# Patient Record
Sex: Female | Born: 2005 | Race: Black or African American | Hispanic: No | Marital: Single | Smoking: Never smoker
Health system: Southern US, Community
[De-identification: ages and names within clinical notes are randomized; demographics above are authoritative.]

## PROBLEM LIST (undated history)

## (undated) DIAGNOSIS — T7840XA Allergy, unspecified, initial encounter: Secondary | ICD-10-CM

## (undated) DIAGNOSIS — J45909 Unspecified asthma, uncomplicated: Secondary | ICD-10-CM

## (undated) DIAGNOSIS — F419 Anxiety disorder, unspecified: Secondary | ICD-10-CM

## (undated) HISTORY — DX: Unspecified asthma, uncomplicated: J45.909

## (undated) HISTORY — PX: NO PAST SURGERIES: SHX2092

## (undated) HISTORY — DX: Anxiety disorder, unspecified: F41.9

## (undated) HISTORY — DX: Allergy, unspecified, initial encounter: T78.40XA

---
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
## 2023-02-07 ENCOUNTER — Ambulatory Visit (INDEPENDENT_AMBULATORY_CARE_PROVIDER_SITE_OTHER): Payer: No Typology Code available for payment source

## 2023-02-07 DIAGNOSIS — J4550 Severe persistent asthma, uncomplicated: Secondary | ICD-10-CM

## 2023-03-14 ENCOUNTER — Ambulatory Visit: Payer: No Typology Code available for payment source | Admitting: Internal Medicine

## 2023-03-14 DIAGNOSIS — J309 Allergic rhinitis, unspecified: Secondary | ICD-10-CM

## 2023-04-04 ENCOUNTER — Ambulatory Visit: Payer: No Typology Code available for payment source

## 2023-04-04 DIAGNOSIS — J4550 Severe persistent asthma, uncomplicated: Secondary | ICD-10-CM | POA: Diagnosis not present

## 2023-06-03 ENCOUNTER — Encounter: Payer: Self-pay | Admitting: Allergy & Immunology

## 2023-06-03 ENCOUNTER — Ambulatory Visit: Payer: No Typology Code available for payment source

## 2023-06-03 ENCOUNTER — Ambulatory Visit: Payer: No Typology Code available for payment source | Admitting: Allergy & Immunology

## 2023-06-03 VITALS — BP 110/80 | HR 87 | Temp 97.8°F | Resp 14 | Ht 66.5 in | Wt 161.4 lb

## 2023-06-03 DIAGNOSIS — J4550 Severe persistent asthma, uncomplicated: Secondary | ICD-10-CM

## 2023-06-03 DIAGNOSIS — J3089 Other allergic rhinitis: Secondary | ICD-10-CM

## 2023-06-03 DIAGNOSIS — J302 Other seasonal allergic rhinitis: Secondary | ICD-10-CM | POA: Diagnosis not present

## 2023-06-03 MED ORDER — CETIRIZINE HCL 10 MG PO TABS: 10.0000 mg | ORAL_TABLET | Freq: Two times a day (BID) | ORAL | 5 refills | Status: DC | PRN

## 2023-06-03 MED ORDER — BREZTRI AEROSPHERE 160-9-4.8 MCG/ACT IN AERO: 2.0000 | INHALATION_SPRAY | Freq: Two times a day (BID) | RESPIRATORY_TRACT | 5 refills | Status: DC

## 2023-06-03 MED ORDER — FLUTICASONE PROPIONATE 50 MCG/ACT NA SUSP: NASAL | 5 refills | Status: AC

## 2023-06-03 NOTE — Progress Notes (Signed)
 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 Moderate persistent asthma, uncomplicated - Lung testing looks beautiful today (over 100%).  - Daily controller medication(s): Breztri  two puffs twice daily + Fasenra  every 8 wks - Prior to physical activity: albuterol  2 puffs 10-15 minutes before physical activity. - Rescue medications: albuterol  4 puffs every 4-6 hours as needed - Asthma control goals:  * Full participation in all desired activities (may need albuterol  before activity) * Albuterol  use two time or less a week on average (not counting use with activity) * Cough interfering with sleep two time or less a month * Oral steroids no more than once a year * No hospitalizations  2. Seasonal and perennial allergic rhinitis - Continue with fluticasone  two sprays per nostril twice daily. - Continue with cetirizine  10mg  daily (can use twice daily on bad days).  - Consider allergy shots in the future for long term control.   3. Return in about 6 months (around 12/01/2023).    Subjective:   Beth Cook is a 18 y.o. female presenting today for follow up of  Chief Complaint  Patient presents with   Follow-up    Beth Cook has a history of the following: Patient Active Problem List   Diagnosis Date Noted   Severe persistent asthma with acute exacerbation 04/30/2022   Seasonal and perennial allergic rhinitis 04/30/2022   Numbness and tingling in right hand 02/26/2020     History obtained from: chart review and patient and mother.  Discussed the use of AI scribe software for clinical note transcription with the patient and/or guardian, who gave verbal consent to proceed.  Beth Cook is a 18 y.o. female presenting for a follow up visit.  She was last seen in July 2024.  At that time, we continued with Breztri  2 puffs twice daily as well as Fasenra  every 8 weeks.  For her rhinitis, she was under good control with cetirizine  and Flonase .  Since last visit, she has done very well.   Asthma/Respiratory Symptom History: She remains on Breztri  (two puffs twice a day). This seems to be working very well. She has not been on prednisone  and she has not been using her rescue inhaler.  She expresses satisfaction with her current treatment, describing herself as a new person. She has been managing her symptoms well and has not required any additional medical attention. Beth Cook's asthma has been well controlled. She has not required rescue medication, experienced nocturnal awakenings due to lower respiratory symptoms, nor have activities of daily living been limited. She has required no Emergency Department or Urgent Care visits for her asthma. She has required zero courses of systemic steroids for asthma exacerbations since the last visit. ACT score today is 25, indicating excellent asthma symptom control.   Allergic Rhinitis Symptom History: The patient's medications include Flonase , Breztri , and Zyrtec , which are filled at a local [facility]. She has not reported any issues with medication coverage or access. She denies any recent infections including sinopulmonary infections.   She  still does not think that allergy shots are indicated at this point in time.   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 She has previously worked in engineering geologist.   Otherwise, there have been no changes to her past medical history,  surgical history, family history, or social history.    Review of systems otherwise negative other than that mentioned in the HPI.    Objective:   Blood pressure 110/80, pulse 87, temperature 97.8 F (36.6 C), resp. rate 14, height 5' 6.5 (1.689 m), weight 161 lb 6 oz (73.2 kg), SpO2 99%. Body mass index is 25.66 kg/m.    Physical Exam Vitals reviewed.  Constitutional:      Appearance: She is well-developed.  HENT:     Head: Normocephalic and atraumatic.     Right Ear: Tympanic membrane, ear canal and external ear normal.     Left Ear: Tympanic membrane, ear canal and external ear normal.     Nose: No nasal deformity, septal deviation, mucosal edema or rhinorrhea.     Right Turbinates: Enlarged, swollen and pale.     Left Turbinates: Enlarged, swollen and pale.     Right Sinus: No maxillary sinus tenderness or frontal sinus tenderness.     Left Sinus: No maxillary sinus tenderness or frontal sinus tenderness.     Comments: No polyps noted.     Mouth/Throat:     Mouth: Mucous membranes are not pale and not dry.     Pharynx: Uvula midline.  Eyes:     General: Lids are normal. No allergic shiner.       Right eye: No discharge.        Left eye: No discharge.     Conjunctiva/sclera: Conjunctivae normal.     Right eye: Right conjunctiva is not injected. No chemosis.    Left eye: Left conjunctiva is not injected. No chemosis.    Pupils: Pupils are equal, round, and reactive to light.  Cardiovascular:     Rate and Rhythm: Normal rate and regular rhythm.     Heart sounds: Normal heart sounds.  Pulmonary:     Effort: Pulmonary effort is normal. No tachypnea, accessory muscle usage or respiratory distress.     Breath sounds: Normal breath sounds. No wheezing, rhonchi or rales.  Chest:     Chest wall: No tenderness.  Lymphadenopathy:     Cervical: No cervical adenopathy.  Skin:    Coloration: Skin is not pale.     Findings: No abrasion, erythema, petechiae or rash. Rash is  not papular, urticarial or vesicular.  Neurological:     Mental Status: She is alert.  Psychiatric:        Behavior: Behavior is cooperative.      Diagnostic studies:    Spirometry: results normal (FEV1: 3.36/110%, FVC: 4.28/123%, FEV1/FVC: 79%).    Spirometry consistent with normal pattern.    Allergy Studies: none       Marty Shaggy, MD  [facility] 

## 2023-06-03 NOTE — Patient Instructions (Addendum)
 1. Moderate persistent asthma, uncomplicated - Lung testing looks beautiful today (over 100%).  - Daily controller medication(s): Breztri  two puffs twice daily + Fasenra  every 8 wks - Prior to physical activity: albuterol  2 puffs 10-15 minutes before physical activity. - Rescue medications: albuterol  4 puffs every 4-6 hours as needed - Asthma control goals:  * Full participation in all desired activities (may need albuterol  before activity) * Albuterol  use two time or less a week on average (not counting use with activity) * Cough interfering with sleep two time or less a month * Oral steroids no more than once a year * No hospitalizations  2. Seasonal and perennial allergic rhinitis - Continue with fluticasone  two sprays per nostril twice daily. - Continue with cetirizine  10mg  daily (can use twice daily on bad days).  - Consider allergy shots in the future for long term control.   3. Return in about 6 months (around 12/01/2023).    Please inform us  of any Emergency Department visits, hospitalizations, or changes in symptoms. Call us  before going to the ED for breathing or allergy symptoms since we might be able to fit you in for a sick visit. Feel free to contact us  anytime with any questions, problems, or concerns.  It was a pleasure to see you and your family again today!  Websites that have reliable patient information: 1. American Academy of Asthma, Allergy, and Immunology: www.aaaai.org 2. Food Allergy Research and Education (FARE): foodallergy.org 3. Mothers of Asthmatics: http://www.asthmacommunitynetwork.org 4. American College of Allergy, Asthma, and Immunology: www.acaai.org   COVID-19 Vaccine Information can be found at: podexchange.nl For questions related to vaccine distribution or appointments, please email [email or web address] or call [phone].   We realize that you might be concerned about having an  allergic reaction to the COVID19 vaccines. To help with that concern, WE ARE OFFERING THE COVID19 VACCINES IN OUR OFFICE! Ask the front desk for dates!     "Like" us  on Facebook and Instagram for our latest updates!      A healthy democracy works best when [employer] participate! Make sure you are registered to vote! If you have moved or changed any of your contact information, you will need to get this updated before voting!  In some cases, you MAY be able to register to vote online: [email or web address]

## 2023-06-08 ENCOUNTER — Ambulatory Visit: Payer: No Typology Code available for payment source

## 2023-06-08 ENCOUNTER — Ambulatory Visit: Payer: No Typology Code available for payment source | Admitting: Family Medicine

## 2023-06-13 ENCOUNTER — Other Ambulatory Visit: Payer: Self-pay | Admitting: Allergy & Immunology

## 2023-06-20 ENCOUNTER — Other Ambulatory Visit: Payer: Self-pay

## 2023-06-20 ENCOUNTER — Emergency Department
Admission: EM | Admit: 2023-06-20 | Discharge: 2023-06-20 | Disposition: A | Discharge status: Home / Self Care | Payer: No Typology Code available for payment source | Attending: Emergency Medicine | Admitting: Emergency Medicine

## 2023-06-20 DIAGNOSIS — R112 Nausea with vomiting, unspecified: Secondary | ICD-10-CM | POA: Diagnosis present

## 2023-06-20 DIAGNOSIS — Z20822 Contact with and (suspected) exposure to covid-19: Secondary | ICD-10-CM | POA: Insufficient documentation

## 2023-06-20 DIAGNOSIS — K529 Noninfective gastroenteritis and colitis, unspecified: Secondary | ICD-10-CM | POA: Insufficient documentation

## 2023-06-20 LAB — CBC
HCT: 42.2 % (ref 36.0–46.0)
Hemoglobin: 14 g/dL (ref 12.0–15.0)
MCH: 27.9 pg (ref 26.0–34.0)
MCHC: 33.2 g/dL (ref 30.0–36.0)
MCV: 84.2 fL (ref 80.0–100.0)
Platelets: 348 10*3/uL (ref 150–400)
RBC: 5.01 MIL/uL (ref 3.87–5.11)
RDW: 13.6 % (ref 11.5–15.5)
WBC: 8.2 10*3/uL (ref 4.0–10.5)
nRBC: 0 % (ref 0.0–0.2)

## 2023-06-20 LAB — COMPREHENSIVE METABOLIC PANEL
ALT: 12 U/L (ref 0–44)
AST: 22 U/L (ref 15–41)
Albumin: 4.4 g/dL (ref 3.5–5.0)
Alkaline Phosphatase: 67 U/L (ref 38–126)
Anion gap: 14 (ref 5–15)
BUN: 17 mg/dL (ref 6–20)
CO2: 16 mmol/L — ABNORMAL LOW (ref 22–32)
Calcium: 9.8 mg/dL (ref 8.9–10.3)
Chloride: 107 mmol/L (ref 98–111)
Creatinine, Ser: 0.77 mg/dL (ref 0.44–1.00)
GFR, Estimated: >60 mL/min (ref >60)
Glucose, Bld: 138 mg/dL — ABNORMAL HIGH (ref 70–99)
Potassium: 3.6 mmol/L (ref 3.5–5.1)
Sodium: 137 mmol/L (ref 135–145)
Total Bilirubin: 1.4 mg/dL — ABNORMAL HIGH (ref 0.0–1.2)
Total Protein: 8.1 g/dL (ref 6.5–8.1)

## 2023-06-20 LAB — POC URINE PREG, ED: Preg Test, Ur: NEGATIVE

## 2023-06-20 LAB — URINALYSIS, ROUTINE W REFLEX MICROSCOPIC
Bacteria, UA: NONE SEEN
Glucose, UA: NEGATIVE mg/dL
Ketones, ur: 80 mg/dL — AB
Leukocytes,Ua: NEGATIVE
Nitrite: NEGATIVE
Protein, ur: >=300 mg/dL — AB
Specific Gravity, Urine: 1.035 — ABNORMAL HIGH (ref 1.005–1.030)
pH: 8 (ref 5.0–8.0)

## 2023-06-20 LAB — RESP PANEL BY RT-PCR (RSV, FLU A&B, COVID)  RVPGX2
Influenza A by PCR: NEGATIVE
Influenza B by PCR: NEGATIVE
Resp Syncytial Virus by PCR: NEGATIVE
SARS Coronavirus 2 by RT PCR: NEGATIVE

## 2023-06-20 LAB — LIPASE, BLOOD: Lipase: 24 U/L (ref 11–51)

## 2023-06-20 MED ORDER — ONDANSETRON 4 MG PO TBDP: 4.0000 mg | ORAL_TABLET | Freq: Three times a day (TID) | ORAL | 0 refills | Status: DC | PRN

## 2023-06-20 MED ORDER — ONDANSETRON HCL 4 MG/2ML IJ SOLN
4.0000 mg | Freq: Once | INTRAMUSCULAR | Status: AC
  Administered 2023-06-20: 4 mg via INTRAVENOUS
  Filled 2023-06-20: qty 2

## 2023-06-20 MED ORDER — DICYCLOMINE HCL 10 MG PO CAPS
10.0000 mg | ORAL_CAPSULE | Freq: Once | ORAL | Status: AC
  Administered 2023-06-20: 10 mg via ORAL
  Filled 2023-06-20: qty 1

## 2023-06-20 MED ORDER — DICYCLOMINE HCL 10 MG PO CAPS: 10.0000 mg | ORAL_CAPSULE | Freq: Three times a day (TID) | ORAL | 0 refills | Status: DC | PRN

## 2023-06-20 MED ORDER — ONDANSETRON 4 MG PO TBDP
4.0000 mg | ORAL_TABLET | Freq: Once | ORAL | Status: AC | PRN
  Administered 2023-06-20: 4 mg via ORAL
  Filled 2023-06-20: qty 1

## 2023-06-20 MED ORDER — SODIUM CHLORIDE 0.9 % IV BOLUS
1000.0000 mL | Freq: Once | INTRAVENOUS | Status: AC
  Administered 2023-06-20: 1000 mL via INTRAVENOUS

## 2023-06-20 NOTE — ED Provider Notes (Signed)
[facility] Provider Note    Event Date/Time   First MD Initiated Contact with Patient 06/20/23 1225     (approximate)   History   Emesis   HPI  Beth Cook is a 18 y.o. female  who presents to the emergency department today because of concern for nausea, vomiting and diarrhea accompanied by abdominal pain. Symptoms started overnight. She has had multiple episodes of both diarrhea and emesis. Has not been able to keep anything down. Has been having lower abdominal pain with this. The patient denies any known sick contacts or unusual ingestions. She feels that she has had fevers. Denies history of abdominal issues.     Physical Exam   Triage Vital Signs: ED Triage Vitals  Encounter Vitals Group     BP 06/20/23 0935 119/64     Systolic BP Percentile --      Diastolic BP Percentile --      Pulse Rate 06/20/23 0936 88     Resp 06/20/23 0935 16     Temp 06/20/23 0935 98.1 F (36.7 C)     Temp src --      SpO2 06/20/23 0936 100 %     Weight 06/20/23 0936 155 lb (70.3 kg)     Height 06/20/23 0936 5\' 6"  (1.676 m)     Head Circumference --      Peak Flow --      Pain Score 06/20/23 0935 9     Pain Loc --      Pain Education --      Exclude from Growth Chart --     Most recent vital signs: Vitals:   06/20/23 0935 06/20/23 0936  BP: 119/64   Pulse:  88  Resp: 16   Temp: 98.1 F (36.7 C)   SpO2:  100%   General: Awake, alert, oriented. CV:  Good peripheral perfusion. Regular rate and rhythm. Resp:  Normal effort. Lungs clear. Abd:  No distention. Tender to palpation in the lower abdomen.    ED Results / Procedures / Treatments   Labs (all labs ordered are listed, but only abnormal results are displayed) Labs Reviewed  COMPREHENSIVE METABOLIC PANEL - Abnormal; Notable for the following components:      Result Value   CO2 16 (*)    Glucose, Bld 138 (*)    Total Bilirubin 1.4 (*)    All other components within normal limits  URINALYSIS,  ROUTINE W REFLEX MICROSCOPIC - Abnormal; Notable for the following components:   Color, Urine AMBER (*)    APPearance HAZY (*)    Specific Gravity, Urine 1.035 (*)    Hgb urine dipstick SMALL (*)    Bilirubin Urine SMALL (*)    Ketones, ur 80 (*)    Protein, ur >=300 (*)    All other components within normal limits  RESP PANEL BY RT-PCR (RSV, FLU A&B, COVID)  RVPGX2  LIPASE, BLOOD  CBC  POC URINE PREG, ED     EKG  None   RADIOLOGY None  PROCEDURES:  Critical Care performed: No   MEDICATIONS ORDERED IN ED: Medications  ondansetron (ZOFRAN-ODT) disintegrating tablet 4 mg (4 mg Oral Given 06/20/23 0939)  sodium chloride 0.9 % bolus 1,000 mL (1,000 mLs Intravenous New Bag/Given 06/20/23 1330)  ondansetron (ZOFRAN) injection 4 mg (4 mg Intravenous Given 06/20/23 1330)     IMPRESSION / MDM / ASSESSMENT AND PLAN / ED COURSE  I reviewed the triage vital signs and the nursing notes.  Differential diagnosis includes, but is not limited to, gastroenteritis, food poisoning, pregnancy  Patient's presentation is most consistent with acute presentation with potential threat to life or bodily function.  Patient presented to the emergency department today because of concerns for nausea vomiting diarrhea that started last night.  On exam patient has slight abdominal tenderness.  Blood work however without leukocytosis patient is afebrile.  Do think likely gastroenteritis.  Patient will be given IV fluids and medication.   FINAL CLINICAL IMPRESSION(S) / ED DIAGNOSES   Final diagnoses:  Gastroenteritis     Note:  This document was prepared using Dragon voice recognition software and may include unintentional dictation errors.    Phineas Semen, MD 06/20/23 [phone]

## 2023-06-20 NOTE — Discharge Instructions (Addendum)
Please seek medical attention for any high fevers, chest pain, shortness of breath, change in behavior, persistent vomiting, bloody stool or any other new or concerning symptoms.

## 2023-06-20 NOTE — ED Triage Notes (Signed)
Pt to ED for emesis, diarrhea, chills started last night.

## 2023-07-15 ENCOUNTER — Ambulatory Visit: Payer: No Typology Code available for payment source

## 2023-08-01 ENCOUNTER — Ambulatory Visit: Payer: No Typology Code available for payment source

## 2023-08-05 ENCOUNTER — Ambulatory Visit: Payer: No Typology Code available for payment source

## 2023-08-05 DIAGNOSIS — J4550 Severe persistent asthma, uncomplicated: Secondary | ICD-10-CM

## 2023-09-30 ENCOUNTER — Ambulatory Visit

## 2023-09-30 DIAGNOSIS — J4550 Severe persistent asthma, uncomplicated: Secondary | ICD-10-CM | POA: Diagnosis not present

## 2023-10-14 ENCOUNTER — Telehealth: Payer: Self-pay | Admitting: Family Medicine

## 2023-10-14 NOTE — Telephone Encounter (Signed)
 Called and left patient voicemail about 6/10 appt being cancelled due to provider not available that day. Ok to reschedule if patient calls back

## 2023-10-27 ENCOUNTER — Ambulatory Visit: Admitting: Family Medicine

## 2023-11-01 ENCOUNTER — Ambulatory Visit: Admitting: Family Medicine

## 2023-12-09 ENCOUNTER — Other Ambulatory Visit: Payer: Self-pay

## 2023-12-09 ENCOUNTER — Ambulatory Visit

## 2023-12-09 ENCOUNTER — Ambulatory Visit: Payer: No Typology Code available for payment source | Admitting: Allergy & Immunology

## 2023-12-09 VITALS — BP 118/70 | HR 116 | Temp 98.5°F | Resp 18 | Ht 66.93 in | Wt 160.6 lb

## 2023-12-09 DIAGNOSIS — J4550 Severe persistent asthma, uncomplicated: Secondary | ICD-10-CM

## 2023-12-09 DIAGNOSIS — J302 Other seasonal allergic rhinitis: Secondary | ICD-10-CM | POA: Diagnosis not present

## 2023-12-09 DIAGNOSIS — J3089 Other allergic rhinitis: Secondary | ICD-10-CM

## 2023-12-09 NOTE — Patient Instructions (Addendum)
 1. Moderate persistent asthma, uncomplicated - Lung testing looks beautiful today (well over 100%).  - Daily controller medication(s): Breztri  one puff twice daily + Fasenra  every 8 wks - Prior to physical activity: albuterol  2 puffs 10-15 minutes before physical activity. - Rescue medications: albuterol  4 puffs every 4-6 hours as needed - Asthma control goals:  * Full participation in all desired activities (may need albuterol  before activity) * Albuterol  use two time or less a week on average (not counting use with activity) * Cough interfering with sleep two time or less a month * Oral steroids no more than once a year * No hospitalizations  2. Seasonal and perennial allergic rhinitis - Continue with fluticasone  two sprays per nostril twice daily. - Continue with cetirizine  10mg  daily (can use twice daily on bad days).   3. Return in about 6 months (around 06/10/2024). You can have the follow up appointment with Dr. Iva or a Nurse Practicioner (our Nurse Practitioners are excellent and always have Physician oversight!).    If you are interested in a [employer], email our Engineer, manufacturing ([place]: [email or web address])!!   Please inform us  of any Emergency Department visits, hospitalizations, or changes in symptoms. Call us  before going to the ED for breathing or allergy symptoms since we might be able to fit you in for a sick visit. Feel free to contact us  anytime with any questions, problems, or concerns.  It was a pleasure to see you and your family again today!  Websites that have reliable patient information: 1. American Academy of Asthma, Allergy, and Immunology: www.aaaai.org 2. Food Allergy Research and Education (FARE): foodallergy.org 3. Mothers of Asthmatics: http://www.asthmacommunitynetwork.org 4. American College of Allergy, Asthma, and Immunology: www.acaai.org      "Like" us  on Facebook and Instagram for our latest updates!       A healthy democracy works best when [employer] participate! Make sure you are registered to vote! If you have moved or changed any of your contact information, you will need to get this updated before voting! Scan the QR codes below to learn more!

## 2023-12-09 NOTE — Progress Notes (Signed)
 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 Moderate persistent asthma, uncomplicated - Lung testing looks beautiful today (well over 100%).  - Daily controller medication(s): Breztri  one puff twice daily + Fasenra  every 8 wks - Prior to physical activity: albuterol  2 puffs 10-15 minutes before physical activity. - Rescue medications: albuterol  4 puffs every 4-6 hours as needed - Asthma control goals:  * Full participation in all desired activities (may need albuterol  before activity) * Albuterol  use two time or less a week on average (not counting use with activity) * Cough interfering with sleep two time or less a month * Oral steroids no more than once a year * No hospitalizations  2. Seasonal and perennial allergic rhinitis - Continue with fluticasone  two sprays per nostril twice daily. - Continue with cetirizine  10mg  daily (can use twice daily on bad days).   3. Return in about 6 months (around 06/10/2024). You can have the follow up appointment with Dr. Iva or a Nurse Practicioner (our Nurse Practitioners are excellent and always have Physician oversight!).    Subjective:   Beth Cook is a 18 y.o. female presenting today for follow up of  Chief Complaint  Patient presents with   Asthma    ACT 25    Beth Cook has a history of the following: Patient Active Problem List   Diagnosis Date Noted   Severe persistent asthma with acute exacerbation 04/30/2022    Seasonal and perennial allergic rhinitis 04/30/2022   Numbness and tingling in right hand 02/26/2020    History obtained from: chart review and patient.  Discussed the use of AI scribe software for clinical note transcription with the patient and/or guardian, who gave verbal consent to proceed.  Beth Cook is a 18 y.o. female presenting for a follow up visit.  She was last seen in January 2025. At that time, lung testing looked awesome. She was continued on Breztri  two puffs BID as well as albuterol  as needed. For her rhinitis, we continued with the use of Flonase  and Zyrtec  daily. This seemed to be working well to control her symptoms. She had not needed any prednisone  or ED visits since the last time that we had seen her.  Since the last visit, she has done well.  Discussed the use of AI scribe software for clinical note transcription with the patient, who gave verbal consent to proceed.  History of Present Illness   Beth Cook is a 18 year old female with asthma who presents for a follow-up visit.  Asthma/Respiratory Symptom History: She has been doing well overall with her asthma management. She is using all her prescribed inhalers but is not consistently using her daily inhaler. She is receiving Fasenra  injections every 8 weeks for her asthma. She is currently using Symbicort for her asthma.  Allergic Rhinitis Symptom History: Her allergies are well-controlled with Flonase  and Zyrtec , which she continues to take and finds effective. She has not been on antibiotics for any sinus or ear infections.   In  terms of her social history, she is not currently in college and is exploring job opportunities, including server positions. 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 She lives in [place] and is open to job opportunities in nearby areas like [place] and [place].   Otherwise, there have been no changes to her past medical history,  surgical history, family history, or social history.    Review of systems otherwise negative other than that mentioned in the HPI.    Objective:   Blood pressure 118/70, pulse (!) 116, temperature 98.5 F (36.9 C), temperature source Temporal, resp. rate 18, height 5' 6.93 (1.7 m), weight 160 lb 9.6 oz (72.8 kg), SpO2 96%. Body mass index is 25.21 kg/m.    Physical Exam Vitals reviewed.  Constitutional:      Appearance: She is well-developed.  HENT:     Head: Normocephalic and atraumatic.     Right Ear: Tympanic membrane, ear canal and external ear normal.     Left Ear: Tympanic membrane, ear canal and external ear normal.     Nose: No nasal deformity, septal deviation, mucosal edema or rhinorrhea.     Right Turbinates: Enlarged, swollen and pale.     Left Turbinates: Enlarged, swollen and pale.     Right Sinus: No maxillary sinus tenderness or frontal sinus tenderness.     Left Sinus: No maxillary sinus tenderness or frontal sinus tenderness.     Comments: No polyps noted.     Mouth/Throat:     Mouth: Mucous membranes are not pale and not dry.     Pharynx: Uvula midline.  Eyes:     General: Lids are normal. No allergic shiner.       Right eye: No discharge.        Left eye: No discharge.     Conjunctiva/sclera: Conjunctivae normal.     Right eye: Right conjunctiva is not injected. No chemosis.    Left eye: Left conjunctiva is not injected. No chemosis.    Pupils: Pupils are equal, round, and reactive to light.  Cardiovascular:     Rate and Rhythm: Normal rate and regular rhythm.     Heart sounds: Normal heart sounds.  Pulmonary:     Effort: Pulmonary effort is normal. No tachypnea, accessory muscle usage or respiratory distress.     Breath sounds: Normal breath sounds. No wheezing, rhonchi or rales.  Chest:     Chest wall: No tenderness.  Lymphadenopathy:     Cervical: No cervical adenopathy.  Skin:    Coloration: Skin is not pale.     Findings: No abrasion,  erythema, petechiae or rash. Rash is not papular, urticarial or vesicular.  Neurological:     Mental Status: She is alert.  Psychiatric:        Behavior: Behavior is cooperative.      Diagnostic studies:    Spirometry: results normal (FEV1: 3.34/109%, FVC: 4.67/134%, FEV1/FVC: 72%).    Spirometry consistent with normal pattern.    Allergy Studies: none     Marty Shaggy, MD  [facility] 

## 2023-12-13 ENCOUNTER — Encounter: Payer: Self-pay | Admitting: Allergy & Immunology

## 2024-01-12 ENCOUNTER — Ambulatory Visit: Admitting: Family Medicine

## 2024-02-03 ENCOUNTER — Ambulatory Visit

## 2024-02-03 DIAGNOSIS — J4550 Severe persistent asthma, uncomplicated: Secondary | ICD-10-CM | POA: Diagnosis not present

## 2024-04-02 ENCOUNTER — Ambulatory Visit

## 2024-04-09 ENCOUNTER — Ambulatory Visit

## 2024-04-09 DIAGNOSIS — J4550 Severe persistent asthma, uncomplicated: Secondary | ICD-10-CM

## 2024-04-11 ENCOUNTER — Ambulatory Visit: Admitting: Family Medicine

## 2024-04-11 ENCOUNTER — Encounter: Payer: Self-pay | Admitting: Family Medicine

## 2024-04-11 ENCOUNTER — Other Ambulatory Visit (HOSPITAL_COMMUNITY)
Admission: RE | Admit: 2024-04-11 | Discharge: 2024-04-11 | Disposition: A | Source: Ambulatory Visit | Attending: Family Medicine | Admitting: Family Medicine

## 2024-04-11 VITALS — BP 127/76 | HR 75 | Ht 66.0 in | Wt 160.1 lb

## 2024-04-11 DIAGNOSIS — J4550 Severe persistent asthma, uncomplicated: Secondary | ICD-10-CM

## 2024-04-11 DIAGNOSIS — N8189 Other female genital prolapse: Secondary | ICD-10-CM

## 2024-04-11 DIAGNOSIS — N9089 Other specified noninflammatory disorders of vulva and perineum: Secondary | ICD-10-CM | POA: Insufficient documentation

## 2024-04-11 DIAGNOSIS — R5383 Other fatigue: Secondary | ICD-10-CM | POA: Diagnosis not present

## 2024-04-11 DIAGNOSIS — L659 Nonscarring hair loss, unspecified: Secondary | ICD-10-CM

## 2024-04-11 DIAGNOSIS — Z23 Encounter for immunization: Secondary | ICD-10-CM

## 2024-04-11 NOTE — Patient Instructions (Signed)
 To keep you healthy, please keep in mind the following health maintenance items that you are due for:   Health Maintenance Due  Topic Date Due   HIV Screening  Never done   Meningococcal B Vaccine (2 of 2 - Bexsero SCDM 2-dose series) 06/20/2020   Hepatitis C Screening  Never done   Pneumococcal Vaccine (1 of 2 - PCV) 06/27/2022     Best Wishes,   Dr. Lang

## 2024-04-11 NOTE — Progress Notes (Unsigned)
 New patient visit   Patient: Beth Cook   DOB: January 01, 2006   18 y.o. Female  MRN: [id number] Visit Date: 04/11/2024  Today's healthcare provider: Rockie Agent, MD   Chief Complaint  Patient presents with   Establish Care    Patient is present to establish care with new PCP.  Diet exercise  Vaccines: Will do flu Screenings: Okay to complete HIV hep C   Fatigue    Patient notes fatigue, dizzy/ lightheaded, sweating and excessive body odor for one year. Wanted to check b12, vit d levels to see if she is deficient.     Body odor    Patient notes odor underarms and in groin area that is abnormal, states she is not excessively sweating but does notice an odor.    Subjective    Beth Cook is a 18 y.o. female who presents today as a new patient to establish care.   HPI     Establish Care    Additional comments: Patient is present to establish care with new PCP.  Diet exercise  Vaccines: Will do flu Screenings: Okay to complete HIV hep C        Fatigue    Additional comments: Patient notes fatigue, dizzy/ lightheaded, sweating and excessive body odor for one year. Wanted to check b12, vit d levels to see if she is deficient.          Body odor    Additional comments: Patient notes odor underarms and in groin area that is abnormal, states she is not excessively sweating but does notice an odor.       Last edited by Cherry Chiquita HERO, CMA on 04/11/2024  3:06 PM.       Discussed the use of AI scribe software for clinical note transcription with the patient, who gave verbal consent to proceed.  History of Present Illness Beth Cook is a 18 year old female who presents with fatigue and lightheadedness.  She experiences significant fatigue and occasional lightheadedness without prior workup. She takes vitamin D and collagen supplements due to concerns about potential vitamin deficiencies but does not use a multivitamin regularly.  She has noticed a persistent  strong odor in her underarms and groin area for several months, described as sometimes sweaty and other times fishy, despite regular hygiene practices. There is no excessive sweating or history of similar issues during childhood.  She mentions hair thinning around the edges of her scalp, which she can pull out easily. There is no significant weight loss. She previously took hair, skin, and nail vitamins but does not do so regularly now.  She has a history of asthma since childhood, managed with Breztri  inhaler (160/4.8 mcg) two puffs in the morning and at bedtime, and receives allergy shots every three months. She also takes Zyrtec  10 mg for allergies.  She describes a sensation of pelvic floor weakness and reports a history of prolonged sitting and straining during bowel movements in the past. There is no pain or discomfort, and no worsening over the past year and a half. She does not engage in regular exercise but has started again recently.  Her sleep pattern is irregular, with some days of no sleep and others of excessive sleep. She does not snore frequently.  She is currently trying to find a job and does not want to work in [employer]. She babysits on the side.      Past Medical History:  Diagnosis Date   Allergy  Currently under the care of an allergy specialist   Anxiety    [employer] 02/25/2020   Asthma    Asthma    [employer] 02/25/2020    Outpatient Medications Prior to Visit  Medication Sig   albuterol  (VENTOLIN  HFA) 108 (90 Base) MCG/ACT inhaler Inhale 2 puffs into the lungs every 4 (four) hours as needed for wheezing or shortness of breath.   BREZTRI  AEROSPHERE 160-9-4.8 MCG/ACT AERO Inhale 2 puffs into the lungs in the morning and at bedtime.   cetirizine  (ZYRTEC ) 10 MG tablet Take 1 tablet (10 mg total) by mouth 2 (two) times daily as needed (For Hives).   EPINEPHrine  (EPIPEN  2-PAK) 0.3 mg/0.3 mL IJ SOAJ injection Inject 0.3 mg into the muscle as needed for anaphylaxis  (For difficulty breathing, difficulty swallowing).   FASENRA  30 MG/ML prefilled syringe INJECT 30MG  SUBCUTANEOUSLY EVERY 8 WEEKS   fluticasone  (FLONASE ) 50 MCG/ACT nasal spray 2 sprays each nostril once daily as needed for stuffy nose.   Spacer/Aero-Holding Chambers (AEROCHAMBER PLUS WITH MASK) inhaler 1 each by Other route as directed.   [DISCONTINUED] albuterol  (PROVENTIL ) (2.5 MG/3ML) 0.083% nebulizer solution Take 3 mLs (2.5 mg total) by nebulization every 4 (four) hours as needed for wheezing or shortness of breath.   [DISCONTINUED] dicyclomine  (BENTYL ) 10 MG capsule Take 1 capsule (10 mg total) by mouth 3 (three) times daily as needed (abdominal pain). (Patient not taking: Reported on 12/09/2023)   [DISCONTINUED] ondansetron  (ZOFRAN -ODT) 4 MG disintegrating tablet Take 1 tablet (4 mg total) by mouth every 8 (eight) hours as needed for nausea or vomiting. (Patient not taking: Reported on 12/09/2023)   Facility-Administered Medications Prior to Visit  Medication Dose Route Frequency Provider   Benralizumab  SOSY 30 mg  30 mg Subcutaneous Q8 weeks Iva Marty Saltness, MD    Past Surgical History:  Procedure Laterality Date   NO PAST SURGERIES     Family Status  Relation Name Status   Mother Amy Alive   Father A Curtistine Alive   Sister 76 yo Alive   MGM  Alive   MGF  Alive   PGM  Alive   PGF  Alive   Sister 52 yo Alive   Neg Hx  (Not Specified)  No partnership data on file   Family History  Problem Relation Age of Onset   Hypertension Mother    Obesity Mother    Hypertension Father    Obesity Father    Migraines Neg Hx    Seizures Neg Hx    Depression Neg Hx    Anxiety disorder Neg Hx    Bipolar disorder Neg Hx    Schizophrenia Neg Hx    ADD / ADHD Neg Hx    Autism Neg Hx    Social History   Socioeconomic History   Marital status: Single    Spouse name: Not on file   Number of children: Not on file   Years of education: Not on file   Highest education level: 12th  grade  Occupational History   Not on file  Tobacco Use   Smoking status: Never   Smokeless tobacco: Never  Vaping Use   Vaping status: Never Used  Substance and Sexual Activity   Alcohol use: Never   Drug use: Never   Sexual activity: Never  Other Topics Concern   Not on file  Social History Narrative   Ronika is in the 11th grade at [employer]; she does well in school. She lives with her parents and  sisters.    Social Drivers of Corporate Investment Banker Strain: Low Risk  (04/09/2024)   Overall Financial Resource Strain (CARDIA)    Difficulty of Paying Living Expenses: Not hard at all  Food Insecurity: No Food Insecurity (04/09/2024)   Hunger Vital Sign    Worried About Running Out of Food in the Last Year: Never true    Ran Out of Food in the Last Year: Never true  Transportation Needs: No Transportation Needs (04/09/2024)   PRAPARE - Administrator, Civil Service (Medical): No    Lack of Transportation (Non-Medical): No  Physical Activity: Inactive (04/09/2024)   Exercise Vital Sign    Days of Exercise per Week: 0 days    Minutes of Exercise per Session: Not on file  Stress: No Stress Concern Present (04/09/2024)   [employer] of Occupational Health - Occupational Stress Questionnaire    Feeling of Stress: Only a little  Social Connections: Moderately Isolated (04/09/2024)   Social Connection and Isolation Panel    Frequency of Communication with Friends and Family: More than three times a week    Frequency of Social Gatherings with Friends and Family: Once a week    Attends Religious Services: More than 4 times per year    Active Member of Clubs or Organizations: No    Attends Engineer, Structural: Not on file    Marital Status: Never married     Allergies  Allergen Reactions   Meningococcal-Containing Vaccines Shortness Of Breath and Rash   Neomycin Rash, Shortness Of Breath and Nausea And Vomiting    NeoMycin and Polymyxin  B sulfates and Hydrocortisone  Otic Suspension  NeoMycin and Polymyxin B sulfates and Hydrocortisone  Otic Suspension    NeoMycin and Polymyxin B sulfates and Hydrocortisone  Otic Suspension NeoMycin and Polymyxin B sulfates and Hydrocortisone  Otic Suspension  NeoMycin and Polymyxin B sulfates and Hydrocortisone  Otic Suspension, , NeoMycin and Polymyxin B sulfates and Hydrocortisone  Otic Suspension NeoMycin and Polymyxin B sulfates and Hydrocortisone  Otic Suspension   Sulfa Antibiotics Shortness Of Breath    Immunization History  Administered Date(s) Administered   DTaP / Hep B / IPV 09/26/2003   DTaP / IPV 02/16/2008   Dtap, Unspecified 12/02/2003, 01/31/2004, 11/04/2004   HIB (PRP-OMP) 09/26/2003   HIB (PRP-T) 01/31/2004, 05/28/2004   HIB, Unspecified 12/02/2003   Hep B, Unspecified 01-30-04, 05/28/2004   Hepatitis A, Ped/Adol-2 Dose 11/17/2009, 07/04/2013   IPV 05/28/2004   Influenza, Seasonal, Injecte, Preservative Fre 01/26/2009, 04/11/2024   Influenza,inj,Quad PF,6+ Mos 07/04/2013   Influenza-Unspecified 05/19/2004, 05/03/2005, 02/16/2008   MMR 11/04/2004, 02/16/2008   MenQuadfi_Meningococcal Groups ACYW Conjugate 12/19/2019   Meningococcal B, OMV 12/19/2019   Meningococcal Conjugate 02/16/2016   Pneumococcal Conjugate PCV 7 09/26/2003, 12/02/2003, 01/31/2004, 05/28/2004   Polio, Unspecified 12/02/2003   Tdap 02/16/2016   Varicella 11/04/2004, 02/16/2008    Health Maintenance  Topic Date Due   HIV Screening  Never done   Meningococcal B Vaccine (2 of 2 - Bexsero SCDM 2-dose series) 06/20/2020   Hepatitis C Screening  Never done   Pneumococcal Vaccine (1 of 2 - PCV) 06/27/2022   COVID-19 Vaccine (1 - 2025-26 season) 04/27/2024 (Originally 01/23/2024)   HPV VACCINES (1 - 3-dose series) 04/11/2025 (Originally 06/27/2018)   DTaP/Tdap/Td (7 - Td or Tdap) 02/15/2026   Influenza Vaccine  Completed   Hepatitis B Vaccines 19-59 Average Risk  Discontinued    Patient Care  Team: Sharma Coyer, MD as PCP - General (Family Medicine) Iva Leisure  Morton, MD as Consulting Physician (Allergy and Immunology)  Review of Systems  Last CBC Lab Results  Component Value Date   WBC 8.2 06/20/2023   HGB 14.0 06/20/2023   HCT 42.2 06/20/2023   MCV 84.2 06/20/2023   MCH 27.9 06/20/2023   RDW 13.6 06/20/2023   PLT 348 06/20/2023   Last metabolic panel Lab Results  Component Value Date   GLUCOSE 138 (H) 06/20/2023   NA 137 06/20/2023   K 3.6 06/20/2023   CL 107 06/20/2023   CO2 16 (L) 06/20/2023   BUN 17 06/20/2023   CREATININE 0.77 06/20/2023   GFRNONAA >60 06/20/2023   CALCIUM 9.8 06/20/2023   PROT 8.1 06/20/2023   ALBUMIN 4.4 06/20/2023   BILITOT 1.4 (H) 06/20/2023   ALKPHOS 67 06/20/2023   AST 22 06/20/2023   ALT 12 06/20/2023   ANIONGAP 14 06/20/2023   Last lipids No results found for: CHOL, HDL, LDLCALC, LDLDIRECT, TRIG, CHOLHDL Last hemoglobin A1c No results found for: HGBA1C Last thyroid functions No results found for: TSH, T3TOTAL, T4TOTAL, FREET4, THYROIDAB Last vitamin D No results found for: 25OHVITD2, 25OHVITD3, VD25OH Last vitamin B12 and Folate No results found for: VITAMINB12, FOLATE      Objective    BP 127/76 (BP Location: Right Arm, Patient Position: Sitting, Cuff Size: Normal)   Pulse 75   Ht 5' 6 (1.676 m)   Wt 160 lb 1.6 oz (72.6 kg)   LMP 03/21/2024 (Exact Date)   SpO2 100%   BMI 25.84 kg/m  BP Readings from Last 3 Encounters:  04/11/24 127/76  12/09/23 118/70  06/20/23 119/64   Wt Readings from Last 3 Encounters:  04/11/24 160 lb 1.6 oz (72.6 kg)  12/09/23 160 lb 9.6 oz (72.8 kg)  06/20/23 155 lb (70.3 kg) (84%, Z= 0.99)*   * Growth percentiles are based on CDC (Girls, 2-20 Years) data.        Depression Screen    04/11/2024    2:58 PM  PHQ 2/9 Scores  PHQ - 2 Score 3  PHQ- 9 Score 9   No results found for any visits on 04/11/24.   Physical  Exam Vitals reviewed.  Constitutional:      General: She is not in acute distress.    Appearance: Normal appearance. She is not ill-appearing.  Neck:     Thyroid: No thyroid mass, thyromegaly or thyroid tenderness.  Cardiovascular:     Rate and Rhythm: Normal rate and regular rhythm.  Pulmonary:     Effort: Pulmonary effort is normal. No respiratory distress.     Breath sounds: No wheezing, rhonchi or rales.  Neurological:     Mental Status: She is alert and oriented to person, place, and time.  Psychiatric:        Mood and Affect: Mood normal.        Behavior: Behavior normal.    Physical Exam       Assessment & Plan      Problem List Items Addressed This Visit     Severe persistent asthma with acute exacerbation (HCC)   Other Visit Diagnoses       Other fatigue    -  Primary   Relevant Orders   Vitamin D (25 hydroxy)   Vitamin B12   Flu vaccine trivalent PF, 6mos and older(Flulaval,Afluria,Fluarix,Fluzone) (Completed)   Hepatitis C Antibody   HIV antibody (with reflex)   CBC   TSH + free T4   Ferritin   Selenium serum  Copper , serum   Zinc    CMP14+EGFR   Hemoglobin A1c     Vulvar odor       Relevant Orders   Cervicovaginal ancillary only     Immunization due       Relevant Orders   Flu vaccine trivalent PF, 6mos and older(Flulaval,Afluria,Fluarix,Fluzone) (Completed)     Pelvic floor weakness in female       Relevant Orders   Ambulatory referral to Physical Therapy     Hair loss       Relevant Orders   TSH + free T4   Selenium serum   Copper , serum   Zinc        Assessment and Plan Assessment & Plan Fatigue and lightheadedness Chronic problem  Reports fatigue and lightheadedness. Differential includes anemia, vitamin B12 deficiency, vitamin D  deficiency, and thyroid dysfunction. - Ordered CBC, TSH, free T4, vitamin B12, and vitamin D  levels  Abnormal body odor of axilla and groin, subacute  Reports strong odor in axilla and groin area,  described as sweaty and sometimes fishy. No excessive sweating. Possible causes include dietary changes, dehydration, or bacterial vaginosis. - Ordered HIV and hepatitis C screening - Performed vaginal swab to test for bacterial vaginosis  Vulvar and perineal irritation, acute  Reports mild discharge without color change. No significant itching or discomfort. Possible bacterial vaginosis considered. - Performed vaginal swab to test for bacterial vaginosis  Pelvic organ prolapse (uterine) Reports sensation of uterine prolapse, possibly due to weak pelvic floor. No pain or discomfort. Prefers not to use tampons due to prolapse. - Referred to pelvic floor physical therapy  Nonscarring hair loss (edges) Reports hair thinning around the edges of the scalp. Possible vitamin deficiency considered. - Ordered CBC and CMP to assess for anemia and nutritional deficiencies - Recommended taking a prenatal vitamin  Severe persistent asthma Managed with Breztri  and Hasenra injections. - Continue current asthma management with Breztri  and Hasenra injections  General Health Maintenance Requests flu vaccine. Allergic reaction to meningococcal vaccination noted. - Administered flu vaccine      Return in about 6 months (around 10/09/2024) for CPE.      Rockie Agent, MD  [facility] [phone] (phone) [phone] (fax)  [facility]

## 2024-04-13 LAB — CMP14+EGFR
ALT: 7 IU/L (ref 0–32)
AST: 12 IU/L (ref 0–40)
Albumin: 4.9 g/dL (ref 4.0–5.0)
Alkaline Phosphatase: 83 IU/L (ref 42–106)
BUN/Creatinine Ratio: 9 (ref 9–23)
BUN: 7 mg/dL (ref 6–20)
Bilirubin Total: 0.6 mg/dL (ref 0.0–1.2)
CO2: 21 mmol/L (ref 20–29)
Calcium: 10 mg/dL (ref 8.7–10.2)
Chloride: 102 mmol/L (ref 96–106)
Creatinine, Ser: 0.8 mg/dL (ref 0.57–1.00)
Globulin, Total: 2.9 g/dL (ref 1.5–4.5)
Glucose: 82 mg/dL (ref 70–99)
Potassium: 4.1 mmol/L (ref 3.5–5.2)
Sodium: 137 mmol/L (ref 134–144)
Total Protein: 7.8 g/dL (ref 6.0–8.5)
eGFR: 108 mL/min/1.73 (ref >59)

## 2024-04-13 LAB — CBC
Hematocrit: 40.4 % (ref 34.0–46.6)
Hemoglobin: 13.3 g/dL (ref 11.1–15.9)
MCH: 28.8 pg (ref 26.6–33.0)
MCHC: 32.9 g/dL (ref 31.5–35.7)
MCV: 87 fL (ref 79–97)
Platelets: 349 x10E3/uL (ref 150–450)
RBC: 4.62 x10E6/uL (ref 3.77–5.28)
RDW: 13.5 % (ref 11.7–15.4)
WBC: 4.8 x10E3/uL (ref 3.4–10.8)

## 2024-04-13 LAB — CERVICOVAGINAL ANCILLARY ONLY
Bacterial Vaginitis (gardnerella): NEGATIVE
Candida Glabrata: NEGATIVE
Candida Vaginitis: NEGATIVE
Chlamydia: NEGATIVE
Comment: NEGATIVE
Comment: NEGATIVE
Comment: NEGATIVE
Comment: NEGATIVE
Comment: NEGATIVE
Comment: NORMAL
Neisseria Gonorrhea: NEGATIVE
Trichomonas: NEGATIVE

## 2024-04-13 LAB — HIV ANTIBODY (ROUTINE TESTING W REFLEX): HIV Screen 4th Generation wRfx: NONREACTIVE

## 2024-04-13 LAB — SELENIUM SERUM: Selenium, S/P: 147 ug/L (ref 93–198)

## 2024-04-13 LAB — HEPATITIS C ANTIBODY: Hep C Virus Ab: NONREACTIVE

## 2024-04-13 LAB — ZINC: Zinc: 83 ug/dL (ref 44–115)

## 2024-04-13 LAB — HEMOGLOBIN A1C
Est. average glucose Bld gHb Est-mCnc: 103 mg/dL
Hgb A1c MFr Bld: 5.2 % (ref 4.8–5.6)

## 2024-04-13 LAB — FERRITIN: Ferritin: 55 ng/mL (ref 15–150)

## 2024-04-13 LAB — TSH+FREE T4
Free T4: 1.26 ng/dL (ref 0.82–1.77)
TSH: 0.818 u[IU]/mL (ref 0.450–4.500)

## 2024-04-13 LAB — VITAMIN D 25 HYDROXY (VIT D DEFICIENCY, FRACTURES): Vit D, 25-Hydroxy: 37.4 ng/mL (ref 30.0–100.0)

## 2024-04-13 LAB — COPPER, SERUM: Copper: 120 ug/dL (ref 80–158)

## 2024-04-16 ENCOUNTER — Ambulatory Visit: Payer: Self-pay | Admitting: Family Medicine

## 2024-06-04 ENCOUNTER — Ambulatory Visit

## 2024-06-11 ENCOUNTER — Ambulatory Visit

## 2024-06-11 DIAGNOSIS — J4550 Severe persistent asthma, uncomplicated: Secondary | ICD-10-CM

## 2024-06-19 ENCOUNTER — Other Ambulatory Visit: Payer: Self-pay | Admitting: Allergy & Immunology

## 2024-06-29 ENCOUNTER — Ambulatory Visit: Admitting: Allergy & Immunology

## 2024-08-06 ENCOUNTER — Ambulatory Visit

## 2024-09-07 ENCOUNTER — Ambulatory Visit: Admitting: Allergy & Immunology

## 2024-10-11 ENCOUNTER — Encounter: Admitting: Family Medicine
